# Patient Record
Sex: Female | Born: 1995 | Race: White | Hispanic: No | Marital: Single | Smoking: Never smoker
Health system: Southern US, Community
[De-identification: ages and names within clinical notes are randomized; demographics above are authoritative.]

## PROBLEM LIST (undated history)

## (undated) HISTORY — PX: APPENDECTOMY: SHX54

---
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
## 2021-02-05 DIAGNOSIS — M542 Cervicalgia: Secondary | ICD-10-CM | POA: Diagnosis not present

## 2021-02-05 DIAGNOSIS — R519 Headache, unspecified: Secondary | ICD-10-CM | POA: Diagnosis not present

## 2021-09-12 DIAGNOSIS — Z8639 Personal history of other endocrine, nutritional and metabolic disease: Secondary | ICD-10-CM | POA: Diagnosis not present

## 2021-09-12 DIAGNOSIS — F419 Anxiety disorder, unspecified: Secondary | ICD-10-CM | POA: Diagnosis not present

## 2021-09-12 DIAGNOSIS — G4700 Insomnia, unspecified: Secondary | ICD-10-CM | POA: Diagnosis not present

## 2021-09-12 DIAGNOSIS — F32A Depression, unspecified: Secondary | ICD-10-CM | POA: Diagnosis not present
